# Patient Record
Sex: Male | Born: 2005 | Race: Black or African American | Hispanic: No | Marital: Single | Smoking: Never smoker
Health system: Southern US, Community
[De-identification: ages and names within clinical notes are randomized; demographics above are authoritative.]

## PROBLEM LIST (undated history)

## (undated) DIAGNOSIS — S0185XA Open bite of other part of head, initial encounter: Secondary | ICD-10-CM

## (undated) DIAGNOSIS — F989 Unspecified behavioral and emotional disorders with onset usually occurring in childhood and adolescence: Secondary | ICD-10-CM

## (undated) DIAGNOSIS — F909 Attention-deficit hyperactivity disorder, unspecified type: Secondary | ICD-10-CM

## (undated) DIAGNOSIS — T7840XA Allergy, unspecified, initial encounter: Secondary | ICD-10-CM

## (undated) DIAGNOSIS — F419 Anxiety disorder, unspecified: Secondary | ICD-10-CM

## (undated) DIAGNOSIS — L309 Dermatitis, unspecified: Secondary | ICD-10-CM

## (undated) DIAGNOSIS — F32A Depression, unspecified: Secondary | ICD-10-CM

## (undated) DIAGNOSIS — N3944 Nocturnal enuresis: Secondary | ICD-10-CM

## (undated) DIAGNOSIS — Z23 Encounter for immunization: Secondary | ICD-10-CM

## (undated) DIAGNOSIS — F329 Major depressive disorder, single episode, unspecified: Secondary | ICD-10-CM

## (undated) DIAGNOSIS — M92529 Juvenile osteochondrosis of tibia tubercle, unspecified leg: Secondary | ICD-10-CM

## (undated) HISTORY — PX: NOSE SURGERY: SHX723

## (undated) HISTORY — DX: Nocturnal enuresis: N39.44

## (undated) HISTORY — DX: Major depressive disorder, single episode, unspecified: F32.9

## (undated) HISTORY — DX: Attention-deficit hyperactivity disorder, unspecified type: F90.9

## (undated) HISTORY — DX: Depression, unspecified: F32.A

## (undated) HISTORY — DX: Dermatitis, unspecified: L30.9

## (undated) HISTORY — DX: Encounter for immunization: Z23

## (undated) HISTORY — DX: Anxiety disorder, unspecified: F41.9

## (undated) HISTORY — DX: Unspecified behavioral and emotional disorders with onset usually occurring in childhood and adolescence: F98.9

## (undated) HISTORY — DX: Allergy, unspecified, initial encounter: T78.40XA

## (undated) HISTORY — PX: CIRCUMCISION: SUR203

## (undated) HISTORY — DX: Juvenile osteochondrosis of tibia tubercle, unspecified leg: M92.529

## (undated) HISTORY — DX: Open bite of other part of head, initial encounter: S01.85XA

## (undated) HISTORY — PX: TOE SURGERY: SHX1073

---
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
## 2023-02-02 ENCOUNTER — Encounter: Payer: Self-pay | Admitting: Internal Medicine

## 2023-03-09 ENCOUNTER — Encounter: Payer: Medicaid Other | Admitting: Internal Medicine

## 2023-03-15 ENCOUNTER — Encounter: Payer: Self-pay | Admitting: Internal Medicine

## 2023-07-27 ENCOUNTER — Ambulatory Visit: Payer: Self-pay

## 2023-08-03 ENCOUNTER — Encounter: Payer: Self-pay | Admitting: Internal Medicine

## 2023-09-08 DIAGNOSIS — R0683 Snoring: Secondary | ICD-10-CM | POA: Insufficient documentation

## 2023-09-09 ENCOUNTER — Ambulatory Visit: Admitting: Orthopedic Surgery

## 2023-09-09 ENCOUNTER — Encounter: Payer: Self-pay | Admitting: Orthopedic Surgery

## 2023-09-09 VITALS — Ht 71.0 in | Wt 252.0 lb

## 2023-09-09 DIAGNOSIS — M542 Cervicalgia: Secondary | ICD-10-CM

## 2023-09-09 MED ORDER — PREDNISONE 10 MG (48) PO TBPK: ORAL_TABLET | Freq: Every day | ORAL | 0 refills | Status: AC

## 2023-09-09 NOTE — Progress Notes (Signed)
 Chief Complaint  Patient presents with   Shoulder Pain    Left     18 year old male presents for another checkup on his neck and shoulder pain  He has had pain in his neck and shoulder since 2022 has been to therapy 2 times  His x-rays show loss of cervical lordosis  The pain around the left trapezius muscle and lower cervical spine.  He gets an occasional tingle in his left hand  His exam shows tenderness in the left paracervical musculature tenderness at the base of the cervical spine mild discomfort with flexion increased pain and tingling with extension of the cervical spine  He had normal range of motion and strength in his shoulder girdle no reflex abnormalities no sensory loss in the left or right upper extremity  Encounter Diagnosis  Name Primary?   Cervicalgia Yes    No orders of the defined types were placed in this encounter.   Therefore I am going to order an MRI of his cervical spine to look for neurologic impingement

## 2023-09-09 NOTE — Progress Notes (Signed)
   Ht 5' 11 (1.803 m)   Wt 252 lb (114.3 kg)   BMI 35.15 kg/m   Body mass index is 35.15 kg/m.  Chief Complaint  Patient presents with   Shoulder Pain    Both     No diagnosis found.  DOI/DOS/ Date:    Worse Physical therapy did not help

## 2023-09-09 NOTE — Patient Instructions (Signed)
 While we are working on your approval for MRI please go ahead and call to schedule your appointment with [facility] within at least one (1) week.   Central Scheduling [phone]

## 2023-10-24 ENCOUNTER — Telehealth: Payer: Self-pay | Admitting: Orthopedic Surgery

## 2023-10-24 NOTE — Telephone Encounter (Signed)
 Dr. Areatha pt - the pt's mother Shanda lvm on 10/21/23 at 1pm stating the pt bought ins papers to the office 2.5 weeks ago and said Dr. VEAR needed to do a pre-authorization for the MRI.  She stated that they haven't heard anything and would like to know what they need to do and would like a call back.  [phone]

## 2023-10-25 NOTE — Telephone Encounter (Signed)
 This has been approved and pt is aware of the authorization and will contact [name] to get appt

## 2023-10-31 ENCOUNTER — Telehealth: Payer: Self-pay | Admitting: Orthopedic Surgery

## 2023-10-31 DIAGNOSIS — M542 Cervicalgia: Secondary | ICD-10-CM

## 2023-10-31 NOTE — Telephone Encounter (Signed)
 I placed order on 7/18 its gone now  I think Medicaid denied it he needed PT I put in another order

## 2023-10-31 NOTE — Telephone Encounter (Signed)
 Dr. Areatha pt - Shanda Minerva lvm for [facility] stating they called to schedule his MRI and they do not have an order, she would like to for you to call and facilitate this.  [phone]

## 2023-10-31 NOTE — Telephone Encounter (Signed)
 I called to let her know she can call now to schedule

## 2023-10-31 NOTE — Telephone Encounter (Signed)
 It has been authorized but order was previously closed

## 2023-11-03 ENCOUNTER — Other Ambulatory Visit (HOSPITAL_COMMUNITY)

## 2023-11-09 ENCOUNTER — Ambulatory Visit (HOSPITAL_COMMUNITY): Admission: RE | Admit: 2023-11-09 | Source: Ambulatory Visit

## 2023-11-11 ENCOUNTER — Encounter: Payer: Self-pay | Admitting: *Deleted

## 2023-11-25 ENCOUNTER — Telehealth: Payer: Self-pay | Admitting: Orthopedic Surgery

## 2023-11-25 DIAGNOSIS — M542 Cervicalgia: Secondary | ICD-10-CM

## 2023-11-25 NOTE — Telephone Encounter (Signed)
 Dr. Areatha pt - pt's mom Willett Lefeber lvm today at 3:11pm stating that the pt had a MRI scheduled, they went to reschedule it and they told them it had been rescinded.  She wants someone to call him on Monday at [phone], they want to know why he can't have it and if Dr. VEAR is gonna fix it so it can have it

## 2023-11-28 ENCOUNTER — Other Ambulatory Visit: Payer: Self-pay | Admitting: Orthopedic Surgery

## 2023-11-28 DIAGNOSIS — M542 Cervicalgia: Secondary | ICD-10-CM

## 2023-11-28 NOTE — Telephone Encounter (Signed)
 It was cancelled, there are 3 orders now I put it in again / this is 3rd time  The auth expires on 10/31/205 they need to schedule it before then.

## 2023-12-05 ENCOUNTER — Ambulatory Visit (HOSPITAL_COMMUNITY): Admission: RE | Admit: 2023-12-05 | Source: Ambulatory Visit

## 2023-12-09 ENCOUNTER — Ambulatory Visit (HOSPITAL_COMMUNITY)
Admission: RE | Admit: 2023-12-09 | Discharge: 2023-12-09 | Disposition: A | Discharge status: Home / Self Care | Source: Ambulatory Visit | Attending: Orthopedic Surgery | Admitting: Orthopedic Surgery

## 2023-12-09 DIAGNOSIS — M542 Cervicalgia: Secondary | ICD-10-CM | POA: Insufficient documentation

## 2023-12-28 ENCOUNTER — Telehealth: Payer: Self-pay | Admitting: Orthopedic Surgery

## 2023-12-28 NOTE — Telephone Encounter (Signed)
 Dr. Areatha pt - pt's mother, Lou Loewe lvm wanting you to call w/the MRI results.  At his last visit in July, you said:  Return for FOLLOW UP, MRI RESULTS 1 week after mri results, do you still want him to have an appointment or will you call them?  [phone]

## 2024-01-03 NOTE — Telephone Encounter (Signed)
 Needs appointment to review MRI as per notes from last ov Follow up 1 week after MRI

## 2024-01-03 NOTE — Telephone Encounter (Signed)
 Dr. Areatha pt - pt's mom lvm today stating that she is requesting a call from Dr. VEAR.  She stated she called last week and she has not heard from him.  [phone]

## 2024-01-05 ENCOUNTER — Ambulatory Visit: Admitting: Orthopedic Surgery

## 2024-01-05 DIAGNOSIS — M5030 Other cervical disc degeneration, unspecified cervical region: Secondary | ICD-10-CM | POA: Diagnosis not present

## 2024-01-05 NOTE — Progress Notes (Signed)
    01/05/2024   Chief Complaint  Patient presents with   Neck Pain    A little pain today    Results    Review MRI C spine    No diagnosis found.  What pharmacy do you use ? ______[address] _____________________  DOI/DOS/ Date:    Did you get better, worse or no change (Answer below)   Unchanged / has some pain, here to review the MRI

## 2024-01-05 NOTE — Progress Notes (Signed)
   Chief Complaint  Patient presents with   Neck Pain    A little pain today    Results    Review MRI C spine   18 year old male with neck and shoulder pain fairly normal exam other than mild tenderness  He has had 2 sessions of physical therapy we did an MRI because of persistent pain  The MRI which I reviewed shows no surgical disc pathology  Specifically  EXAM: MRI CERVICAL SPINE WITHOUT CONTRAST 12/09/2023 05:37:23 PM   TECHNIQUE: Multiplanar multisequence MRI of the cervical spine was performed.   COMPARISON: Comparison is made with prior radiograph from 04/23/2020.   CLINICAL HISTORY: Neck pain, chronic. Cervicalgia.   FINDINGS:   BONES AND ALIGNMENT: Straightening of the normal cervical lordosis. Normal vertebral body heights. Bone marrow signal is unremarkable.   SPINAL CORD: Normal spinal cord size. No abnormal spinal cord signal.   SOFT TISSUES: No paraspinal mass.   C2-C3: Disc desiccation without significant disc bulge. No stenosis.   C3-C4: Disc desiccation without significant disc bulge. No stenosis.   C4-C5: Disc desiccation without significant disc bulge. No spinal stenosis. Foramina remain adequately patent.   C5-C6: Disc desiccation with mild annular disc bulge. No spinal stenosis. Foramina remain patent.   C6-C7: Unremarkable.   C7-T1: Unremarkable.   IMPRESSION: 1. 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 No significant stenosis or neural impingement.   Electronically signed by: Morene Hoard MD 12/11/2023 01:31 AM EDT RP Workstation: [id number]   We discussed with the patient that he needs to do his exercises he does not like taking medication and he says he can deal with what ever discomfort he is having at this time

## 2024-01-05 NOTE — Patient Instructions (Signed)
   Do the neck exercises   Use a heating pad as needed
